# Patient Record
Sex: Male | Born: 1985 | Race: Black or African American | Hispanic: No | Marital: Single | Smoking: Never smoker
Health system: Southern US, Community
[De-identification: ages and names within clinical notes are randomized; demographics above are authoritative.]

## PROBLEM LIST (undated history)

## (undated) DIAGNOSIS — R55 Syncope and collapse: Secondary | ICD-10-CM

## (undated) HISTORY — DX: Syncope and collapse: R55

---

## 2009-12-24 ENCOUNTER — Emergency Department (HOSPITAL_COMMUNITY): Admission: EM | Admit: 2009-12-24 | Discharge: 2009-12-24 | Payer: Self-pay | Admitting: Emergency Medicine

## 2011-01-28 LAB — HIV ANTIBODY (ROUTINE TESTING W REFLEX): HIV: NONREACTIVE

## 2011-01-28 LAB — RPR: RPR Ser Ql: NONREACTIVE

## 2011-01-28 LAB — GC/CHLAMYDIA PROBE AMP, GENITAL: Chlamydia, DNA Probe: POSITIVE — AB

## 2011-08-14 ENCOUNTER — Emergency Department (HOSPITAL_COMMUNITY)
Admission: EM | Admit: 2011-08-14 | Discharge: 2011-08-14 | Discharge status: Against Medical Advice | Payer: Self-pay | Attending: Emergency Medicine | Admitting: Emergency Medicine

## 2011-08-14 DIAGNOSIS — R5383 Other fatigue: Secondary | ICD-10-CM | POA: Insufficient documentation

## 2011-08-14 DIAGNOSIS — R5381 Other malaise: Secondary | ICD-10-CM | POA: Insufficient documentation

## 2011-08-15 ENCOUNTER — Emergency Department (HOSPITAL_COMMUNITY)
Admission: EM | Admit: 2011-08-15 | Discharge: 2011-08-15 | Disposition: A | Discharge status: Home / Self Care | Payer: Self-pay | Attending: Emergency Medicine | Admitting: Emergency Medicine

## 2011-08-15 DIAGNOSIS — R9431 Abnormal electrocardiogram [ECG] [EKG]: Secondary | ICD-10-CM | POA: Insufficient documentation

## 2011-08-15 DIAGNOSIS — R42 Dizziness and giddiness: Secondary | ICD-10-CM | POA: Insufficient documentation

## 2011-08-15 DIAGNOSIS — R55 Syncope and collapse: Secondary | ICD-10-CM | POA: Insufficient documentation

## 2011-08-15 LAB — POCT I-STAT, CHEM 8
Chloride: 101 mEq/L (ref 96–112)
Glucose, Bld: 83 mg/dL (ref 70–99)
HCT: 52 % (ref 39.0–52.0)
Hemoglobin: 17.7 g/dL — ABNORMAL HIGH (ref 13.0–17.0)
Potassium: 4.3 mEq/L (ref 3.5–5.1)
Sodium: 138 mEq/L (ref 135–145)

## 2011-08-16 ENCOUNTER — Emergency Department (HOSPITAL_COMMUNITY): Payer: Self-pay

## 2011-08-16 ENCOUNTER — Emergency Department (HOSPITAL_COMMUNITY)
Admission: EM | Admit: 2011-08-16 | Discharge: 2011-08-16 | Disposition: A | Discharge status: Home / Self Care | Payer: Self-pay | Attending: Emergency Medicine | Admitting: Emergency Medicine

## 2011-08-16 DIAGNOSIS — R0789 Other chest pain: Secondary | ICD-10-CM | POA: Insufficient documentation

## 2011-08-16 DIAGNOSIS — R42 Dizziness and giddiness: Secondary | ICD-10-CM | POA: Insufficient documentation

## 2011-08-16 DIAGNOSIS — I319 Disease of pericardium, unspecified: Secondary | ICD-10-CM | POA: Insufficient documentation

## 2011-08-16 LAB — POCT I-STAT TROPONIN I: Troponin i, poc: 0 ng/mL (ref 0.00–0.08)

## 2011-08-22 ENCOUNTER — Emergency Department (HOSPITAL_COMMUNITY)
Admission: EM | Admit: 2011-08-22 | Discharge: 2011-08-22 | Disposition: A | Discharge status: Home / Self Care | Payer: Self-pay | Attending: Emergency Medicine | Admitting: Emergency Medicine

## 2011-08-22 DIAGNOSIS — IMO0002 Reserved for concepts with insufficient information to code with codable children: Secondary | ICD-10-CM | POA: Insufficient documentation

## 2011-08-22 DIAGNOSIS — R55 Syncope and collapse: Secondary | ICD-10-CM | POA: Insufficient documentation

## 2011-08-22 DIAGNOSIS — I498 Other specified cardiac arrhythmias: Secondary | ICD-10-CM | POA: Insufficient documentation

## 2011-08-22 DIAGNOSIS — F1010 Alcohol abuse, uncomplicated: Secondary | ICD-10-CM | POA: Insufficient documentation

## 2011-08-22 DIAGNOSIS — F411 Generalized anxiety disorder: Secondary | ICD-10-CM | POA: Insufficient documentation

## 2011-08-22 LAB — DIFFERENTIAL
Basophils Absolute: 0 10*3/uL (ref 0.0–0.1)
Basophils Relative: 1 % (ref 0–1)
Eosinophils Relative: 1 % (ref 0–5)
Monocytes Absolute: 0.5 10*3/uL (ref 0.1–1.0)
Neutro Abs: 3 10*3/uL (ref 1.7–7.7)

## 2011-08-22 LAB — POCT I-STAT, CHEM 8
Creatinine, Ser: 1.4 mg/dL — ABNORMAL HIGH (ref 0.50–1.35)
Glucose, Bld: 89 mg/dL (ref 70–99)
HCT: 42 % (ref 39.0–52.0)
Hemoglobin: 14.3 g/dL (ref 13.0–17.0)
Sodium: 143 mEq/L (ref 135–145)
TCO2: 27 mmol/L (ref 0–100)

## 2011-08-22 LAB — CBC
MCHC: 33.3 g/dL (ref 30.0–36.0)
RDW: 11.9 % (ref 11.5–15.5)
WBC: 5.4 10*3/uL (ref 4.0–10.5)

## 2011-08-22 LAB — POCT I-STAT TROPONIN I: Troponin i, poc: 0 ng/mL (ref 0.00–0.08)

## 2011-09-15 ENCOUNTER — Encounter: Payer: Self-pay | Admitting: Cardiology

## 2011-09-16 ENCOUNTER — Encounter: Payer: Self-pay | Admitting: Cardiology

## 2011-09-16 ENCOUNTER — Ambulatory Visit (INDEPENDENT_AMBULATORY_CARE_PROVIDER_SITE_OTHER): Payer: Self-pay | Admitting: Cardiology

## 2011-09-16 VITALS — BP 140/82 | HR 80 | Ht 70.0 in | Wt 146.1 lb

## 2011-09-16 DIAGNOSIS — R55 Syncope and collapse: Secondary | ICD-10-CM

## 2011-09-16 DIAGNOSIS — R000 Tachycardia, unspecified: Secondary | ICD-10-CM | POA: Insufficient documentation

## 2011-09-16 NOTE — Assessment & Plan Note (Signed)
The episode of syncope in the ER note seems to be compounded by alcohol use. However, he otherwise has a long history of presyncope. I will apply a 21 day event monitor. I will check an echocardiogram. If these are normal it is unlikely he would need further workup.

## 2011-09-16 NOTE — Progress Notes (Signed)
HPI The patient presents for evaluation of syncope. He said he sat a history of near syncope for a couple of years. He said it happens a couple of times daily. Recently however he reports a frank syncopal episode. He was actually playing cards when this happened. I reviewed the report from the emergency room. In the emergency room he apparently had altered mental status and did wake with a sternal rub and ammonia capsule. He was described as "crying and reluctant to answer questions." He did report that he had a sensation of feeling overwhelmed at the time of this event. He denies panic. He's not had a history of anxiety. His cardiac enzymes were negative. He did have the EKG described below. He was not admitted to the hospital. Telemetry did demonstrate sinus tachycardia while in the hospital. Of note the patient is reported to have been drinking that night. I don't see an alcohol level but the ER discharge diagnosis mentions acute alcohol.  The patient has had no prior cardiac workup. He doesn't describe orthostatic symptoms. He does get some chest discomfort. He reports that this can happen at rest. It is sharp and substernal. He doesn't exercise routinely but he climbs stairs to his apartment. His heart will be racing at the end of this but he doesn't necessarily have chest discomfort. He doesn't report any significant shortness of breath, PND or orthopnea. Of note he says that he has had no spells since this ER visit.  No Known Allergies  No current outpatient prescriptions on file.    Past Medical History  Diagnosis Date  . Chest pain   . Syncope     No past surgical history on file.  Family History  Problem Relation Age of Onset  . Stroke Maternal Grandmother 48  . Hypertension Maternal Grandmother     History   Social History  . Marital Status: Single    Spouse Name: N/A    Number of Children: 1  . Years of Education: N/A   Occupational History  . Works in Airline pilot    Social  History Main Topics  . Smoking status: Never Smoker   . Smokeless tobacco: Not on file  . Alcohol Use: No  . Drug Use: No  . Sexually Active: Not on file   Other Topics Concern  . Not on file   Social History Narrative  . No narrative on file    ROS:  As stated in the HPI and negative for all other systems.  PHYSICAL EXAM BP 140/82  Pulse 80  Ht 5\' 10"  (1.778 m)  Wt 146 lb 1.9 oz (66.28 kg)  BMI 20.97 kg/m2 GENERAL:  Well appearing, thin HEENT:  Pupils equal round and reactive, fundi not visualized, oral mucosa unremarkable NECK:  No jugular venous distention, waveform within normal limits, carotid upstroke brisk and symmetric, no bruits, no thyromegaly LYMPHATICS:  No cervical, inguinal adenopathy LUNGS:  Clear to auscultation bilaterally BACK:  No CVA tenderness CHEST:  Unremarkable HEART:  PMI not displaced or sustained,S1 and S2 within normal limits, no S3, no S4, no clicks, no rubs, no murmurs ABD:  Flat, positive bowel sounds normal in frequency in pitch, no bruits, no rebound, no guarding, no midline pulsatile mass, no hepatomegaly, no splenomegaly EXT:  2 plus pulses throughout, no edema, no cyanosis no clubbing SKIN:  No rashes no nodules NEURO:  Cranial nerves II through XII grossly intact, motor grossly intact throughout PSYCH:  Cognitively intact, oriented to person place and time  EKG:  08/22/11  sinus tachycardia, rate 119, questionable left ventricular hypertrophy versus normal pattern for a young thin individual.  No old EKGs to compare to  ASSESSMENT AND PLAN

## 2011-09-16 NOTE — Patient Instructions (Signed)
Your physician has recommended that you wear an event monitor. Event monitors are medical devices that record the heart's electrical activity. Doctors most often [place] these monitors to diagnose arrhythmias. Arrhythmias are problems with the speed or rhythm of the heartbeat. The monitor is a small, portable device. You can wear one while you do your normal daily activities. This is usually used to diagnose what is causing palpitations/syncope (passing out).        21 day event monitor  Your physician has requested that you have an echocardiogram. Echocardiography is a painless test that uses sound waves to create images of your heart. It provides your doctor with information about the size and shape of your heart and how well your heart's chambers and valves are working. This procedure takes approximately one hour. There are no restrictions for this procedure.  Your physician recommends that you return for lab work within the next 2 weeks, same day as event monitor placement.  TSH  Please follow up with Tereso Newcomer, PA in one month OR after event monitor is completed.

## 2011-09-16 NOTE — Assessment & Plan Note (Signed)
I will check a TSH. I will otherwise check the studies listed above.  Further work up will be based on these results.  Again, if these are normal I doubt that further work up will be indicated.

## 2011-09-23 ENCOUNTER — Other Ambulatory Visit (INDEPENDENT_AMBULATORY_CARE_PROVIDER_SITE_OTHER): Payer: Self-pay | Admitting: *Deleted

## 2011-09-23 ENCOUNTER — Encounter (INDEPENDENT_AMBULATORY_CARE_PROVIDER_SITE_OTHER): Payer: Self-pay

## 2011-09-23 ENCOUNTER — Ambulatory Visit (HOSPITAL_COMMUNITY): Payer: Self-pay | Attending: Cardiovascular Disease | Admitting: Radiology

## 2011-09-23 DIAGNOSIS — R55 Syncope and collapse: Secondary | ICD-10-CM

## 2011-09-23 DIAGNOSIS — R000 Tachycardia, unspecified: Secondary | ICD-10-CM | POA: Insufficient documentation

## 2011-10-07 ENCOUNTER — Telehealth: Payer: Self-pay | Admitting: Cardiology

## 2011-10-07 NOTE — Telephone Encounter (Signed)
Pt calling for results of monitor - I will check with Dr Antoine Poche to see if he has the results and what they are.  Pt aware I will call him back with the results.

## 2011-10-07 NOTE — Telephone Encounter (Signed)
New problem Pt is calling about monitor results please call him back

## 2011-10-08 NOTE — Telephone Encounter (Signed)
FU Call: Pt returning call to [facility] from yesterday. Please return pt call to discuss further.

## 2011-10-08 NOTE — Telephone Encounter (Signed)
Reviewed results of monitor with pt and answered his questions.  He thanked me for my time.

## 2011-10-14 ENCOUNTER — Ambulatory Visit: Payer: Self-pay | Admitting: Physician Assistant

## 2011-10-21 ENCOUNTER — Ambulatory Visit: Payer: Self-pay | Admitting: Physician Assistant

## 2014-12-24 ENCOUNTER — Telehealth: Payer: Self-pay | Admitting: *Deleted

## 2014-12-24 ENCOUNTER — Ambulatory Visit: Payer: Self-pay | Admitting: Cardiology

## 2014-12-24 NOTE — Telephone Encounter (Signed)
Attempted to call patient to see why he missed his appt and reschedule the office visit with Dr Antoine PocheHochrein

## 2015-01-07 ENCOUNTER — Encounter: Payer: Self-pay | Admitting: Cardiology

## 2016-12-13 ENCOUNTER — Emergency Department (HOSPITAL_COMMUNITY): Payer: Self-pay

## 2016-12-13 ENCOUNTER — Emergency Department (HOSPITAL_COMMUNITY)
Admission: EM | Admit: 2016-12-13 | Discharge: 2016-12-13 | Disposition: A | Discharge status: Home / Self Care | Payer: Self-pay | Attending: Emergency Medicine | Admitting: Emergency Medicine

## 2016-12-13 ENCOUNTER — Encounter (HOSPITAL_COMMUNITY): Payer: Self-pay | Admitting: Emergency Medicine

## 2016-12-13 DIAGNOSIS — R0789 Other chest pain: Secondary | ICD-10-CM | POA: Insufficient documentation

## 2016-12-13 DIAGNOSIS — Z5321 Procedure and treatment not carried out due to patient leaving prior to being seen by health care provider: Secondary | ICD-10-CM | POA: Insufficient documentation

## 2016-12-13 LAB — CBC
HEMATOCRIT: 41.8 % (ref 39.0–52.0)
HEMOGLOBIN: 13.6 g/dL (ref 13.0–17.0)
MCH: 27.3 pg (ref 26.0–34.0)
MCHC: 32.5 g/dL (ref 30.0–36.0)
MCV: 83.8 fL (ref 78.0–100.0)
Platelets: 292 10*3/uL (ref 150–400)
RBC: 4.99 MIL/uL (ref 4.22–5.81)
RDW: 12.7 % (ref 11.5–15.5)
WBC: 5.6 10*3/uL (ref 4.0–10.5)

## 2016-12-13 LAB — I-STAT TROPONIN, ED: Troponin i, poc: 0 ng/mL (ref 0.00–0.08)

## 2016-12-13 LAB — BASIC METABOLIC PANEL
Anion gap: 8 (ref 5–15)
BUN: 12 mg/dL (ref 6–20)
CHLORIDE: 102 mmol/L (ref 101–111)
CO2: 26 mmol/L (ref 22–32)
Calcium: 9 mg/dL (ref 8.9–10.3)
Creatinine, Ser: 1.08 mg/dL (ref 0.61–1.24)
GFR calc non Af Amer: >60 mL/min (ref >60)
Glucose, Bld: 84 mg/dL (ref 65–99)
POTASSIUM: 3.3 mmol/L — AB (ref 3.5–5.1)
SODIUM: 136 mmol/L (ref 135–145)

## 2016-12-13 NOTE — ED Notes (Signed)
Pt has been called 3 times with no response from lobby.

## 2016-12-13 NOTE — ED Triage Notes (Signed)
Pt complaint of intermittent central chest tightness onset last night; associated "light" left arm.

## 2016-12-13 NOTE — ED Triage Notes (Signed)
1st call for pt with no response from lobby.

## 2017-06-20 IMAGING — CR DG CHEST 2V
2 series · 2 of 2 positions shown · non-contrast
Comparison: 08/16/2011

CLINICAL DATA: Central chest tightness.

EXAM:
CHEST  2 VIEW

[w chest pa]
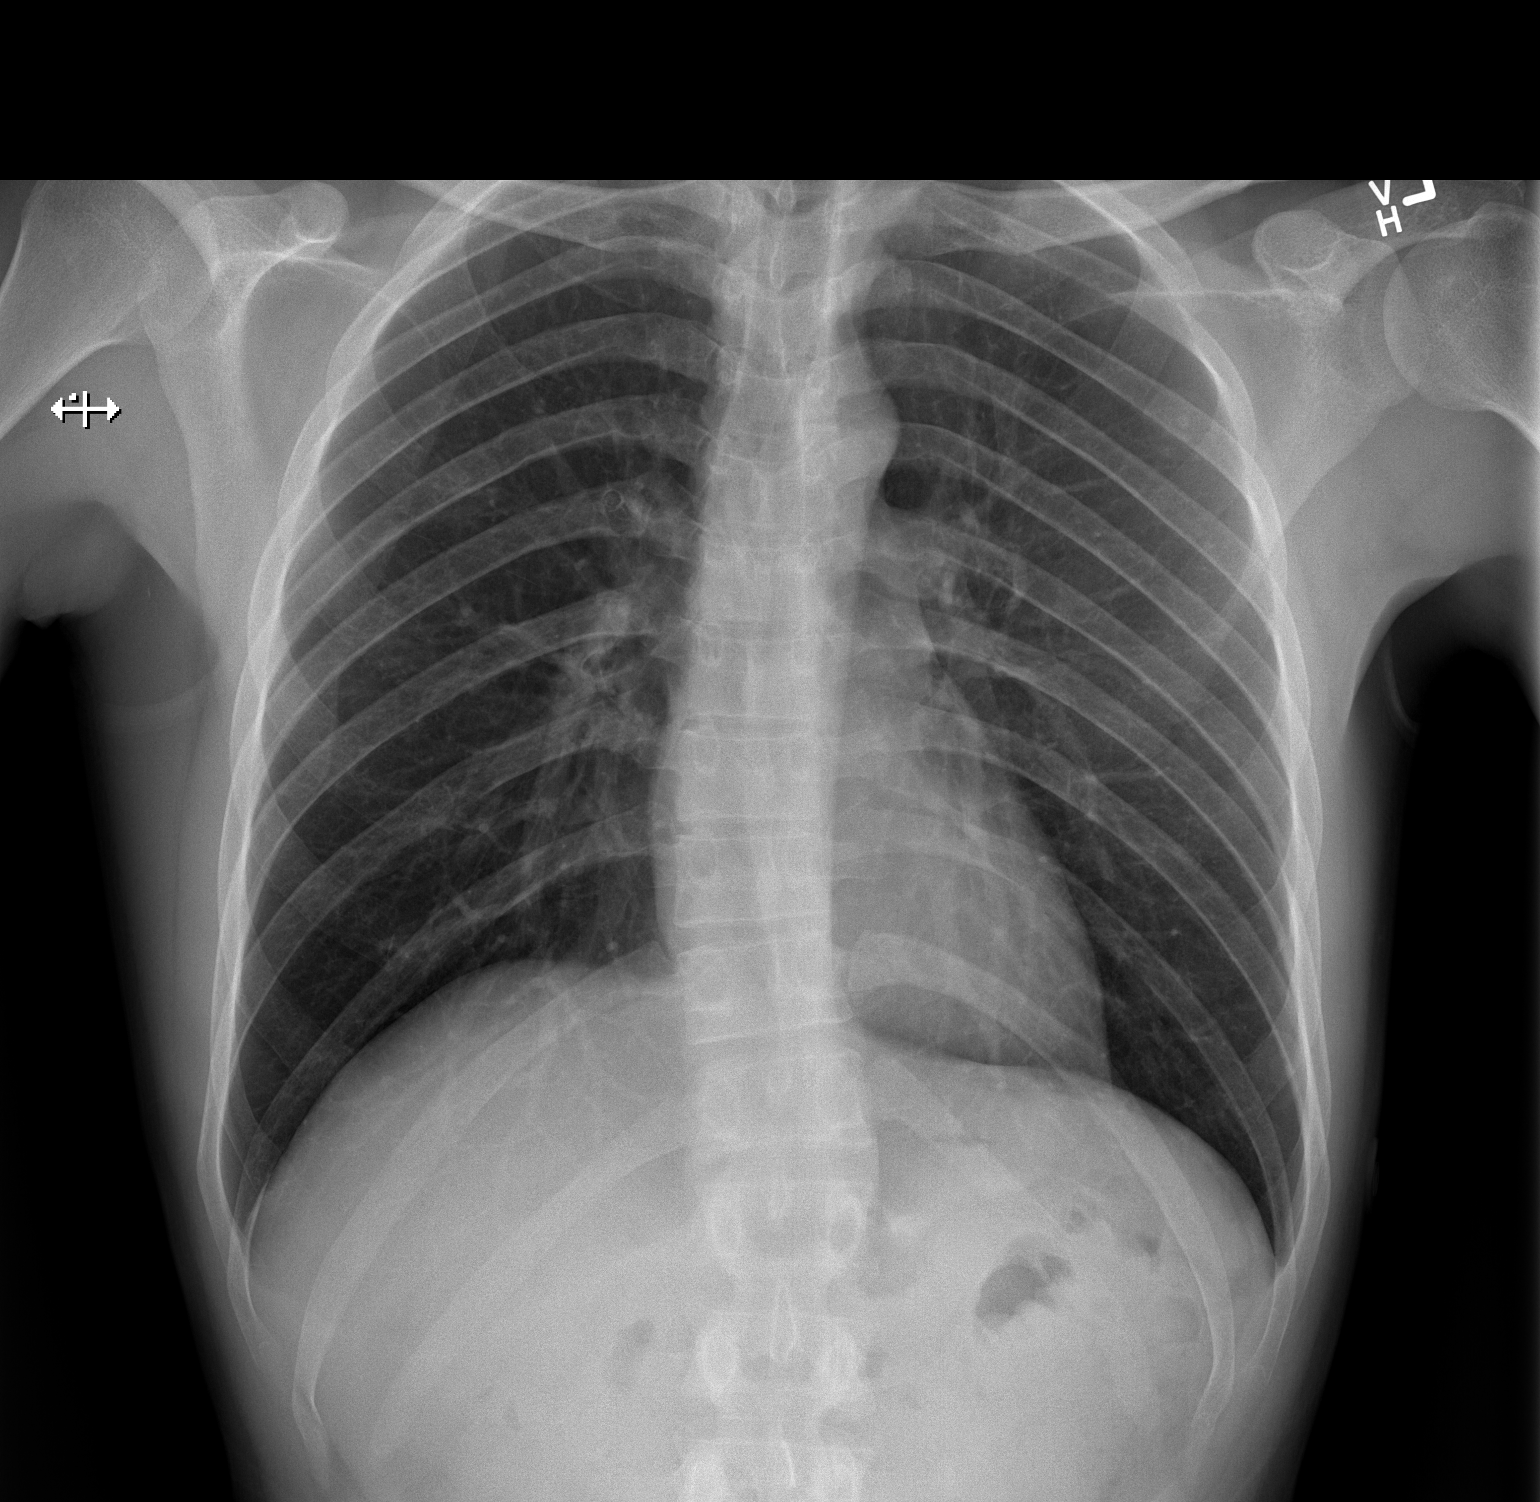

[w chest lat]
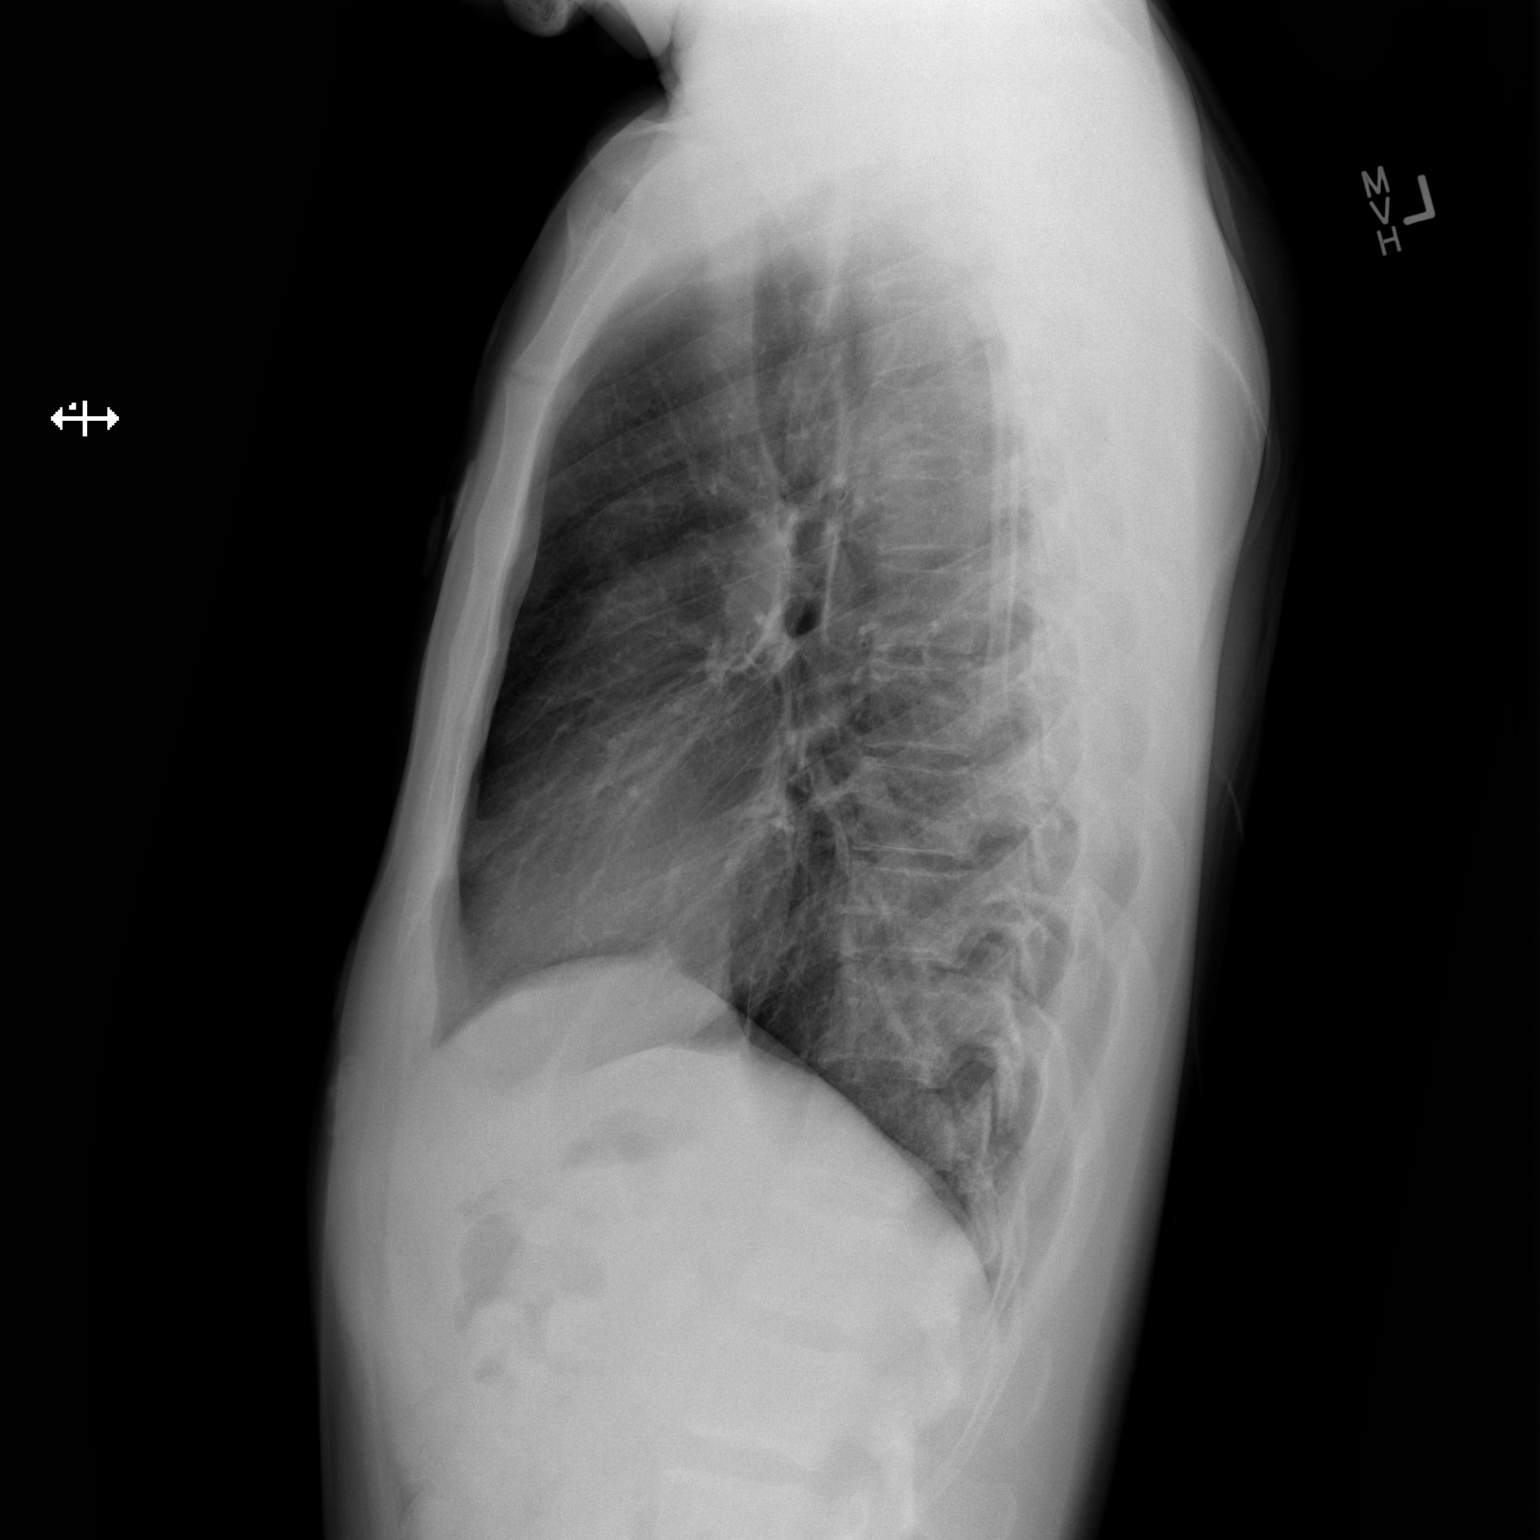

[2 of 2 positions shown; findings below may reference images not displayed]

FINDINGS: The lungs are clear wiithout focal pneumonia, edema, pneumothorax or
pleural effusion. The cardiopericardial silhouette is within normal
limits for size. Thoracolumbar scoliosis again noted. Nodular
density/densities projecting over the lungs are compatible with pads
for telemetry leads.
IMPRESSION: No active cardiopulmonary disease.

## 2021-06-05 ENCOUNTER — Emergency Department (HOSPITAL_COMMUNITY)
Admission: EM | Admit: 2021-06-05 | Discharge: 2021-06-05 | Disposition: A | Discharge status: Home / Self Care | Payer: Self-pay | Attending: Emergency Medicine | Admitting: Emergency Medicine

## 2021-06-05 ENCOUNTER — Emergency Department (HOSPITAL_COMMUNITY): Payer: Self-pay

## 2021-06-05 ENCOUNTER — Other Ambulatory Visit: Payer: Self-pay

## 2021-06-05 ENCOUNTER — Encounter (HOSPITAL_COMMUNITY): Payer: Self-pay

## 2021-06-05 DIAGNOSIS — R000 Tachycardia, unspecified: Secondary | ICD-10-CM | POA: Insufficient documentation

## 2021-06-05 DIAGNOSIS — Z28310 Unvaccinated for covid-19: Secondary | ICD-10-CM | POA: Insufficient documentation

## 2021-06-05 DIAGNOSIS — U071 COVID-19: Secondary | ICD-10-CM | POA: Insufficient documentation

## 2021-06-05 LAB — COMPREHENSIVE METABOLIC PANEL
ALT: 15 U/L (ref 0–44)
AST: 35 U/L (ref 15–41)
Albumin: 3.6 g/dL (ref 3.5–5.0)
Alkaline Phosphatase: 39 U/L (ref 38–126)
Anion gap: 8 (ref 5–15)
BUN: 12 mg/dL (ref 6–20)
CO2: 25 mmol/L (ref 22–32)
Calcium: 8.9 mg/dL (ref 8.9–10.3)
Chloride: 101 mmol/L (ref 98–111)
Creatinine, Ser: 1.11 mg/dL (ref 0.61–1.24)
GFR, Estimated: >60 mL/min (ref >60)
Glucose, Bld: 98 mg/dL (ref 70–99)
Potassium: 4.2 mmol/L (ref 3.5–5.1)
Sodium: 134 mmol/L — ABNORMAL LOW (ref 135–145)
Total Bilirubin: 1.5 mg/dL — ABNORMAL HIGH (ref 0.3–1.2)
Total Protein: 7 g/dL (ref 6.5–8.1)

## 2021-06-05 LAB — CBC WITH DIFFERENTIAL/PLATELET
Abs Immature Granulocytes: 0.01 10*3/uL (ref 0.00–0.07)
Basophils Absolute: 0 10*3/uL (ref 0.0–0.1)
Basophils Relative: 1 %
Eosinophils Absolute: 0 10*3/uL (ref 0.0–0.5)
Eosinophils Relative: 0 %
HCT: 49.9 % (ref 39.0–52.0)
Hemoglobin: 16 g/dL (ref 13.0–17.0)
Immature Granulocytes: 0 %
Lymphocytes Relative: 51 %
Lymphs Abs: 1.4 10*3/uL (ref 0.7–4.0)
MCH: 28 pg (ref 26.0–34.0)
MCHC: 32.1 g/dL (ref 30.0–36.0)
MCV: 87.4 fL (ref 80.0–100.0)
Monocytes Absolute: 0.6 10*3/uL (ref 0.1–1.0)
Monocytes Relative: 20 %
Neutro Abs: 0.8 10*3/uL — ABNORMAL LOW (ref 1.7–7.7)
Neutrophils Relative %: 28 %
Platelets: 210 10*3/uL (ref 150–400)
RBC: 5.71 MIL/uL (ref 4.22–5.81)
RDW: 11.9 % (ref 11.5–15.5)
WBC: 2.8 10*3/uL — ABNORMAL LOW (ref 4.0–10.5)
nRBC: 0 % (ref 0.0–0.2)

## 2021-06-05 LAB — TROPONIN I (HIGH SENSITIVITY): Troponin I (High Sensitivity): 6 ng/L (ref <18)

## 2021-06-05 MED ORDER — ACETAMINOPHEN 325 MG PO TABS
650.0000 mg | ORAL_TABLET | Freq: Once | ORAL | Status: AC
  Administered 2021-06-05: 650 mg via ORAL
  Filled 2021-06-05: qty 2

## 2021-06-05 MED ORDER — SODIUM CHLORIDE 0.9 % IV BOLUS
1000.0000 mL | Freq: Once | INTRAVENOUS | Status: AC
  Administered 2021-06-05: 1000 mL via INTRAVENOUS

## 2021-06-05 NOTE — ED Notes (Signed)
C/o face head and CP/pressure. Endorses NVD. Onset Wednesday.

## 2021-06-05 NOTE — Discharge Instructions (Addendum)
Please continue to take Tylenol and ibuprofen for management of your body aches.  If you develop any new or worsening symptoms please come back to the emergency department.  It was a pleasure to meet you.

## 2021-06-05 NOTE — ED Notes (Signed)
Ambulated pt in room, pt's O2 saturation remained at 100% while ambulating. Patient stated he did not feel lightheaded or short of breath while ambulating.

## 2021-06-05 NOTE — ED Triage Notes (Signed)
Patient complains of ongoing SOB, weakness, diarrhea and headache since testing positive for Covid on Wednesday 7/27. No vaccines. Alert and oriented, in no distress

## 2021-06-05 NOTE — ED Provider Notes (Signed)
[facility] EMERGENCY DEPARTMENT Provider Note   CSN: [id number] Arrival date & time: 06/05/21  6213     History No chief complaint on file.   Antonio Bell is a 35 y.o. male.  HPI Patient is a 35 year old male who presents to the emergency department due to ongoing shortness of breath with exertion, weakness, diarrhea, intermittent headaches, cough, congestion.  Patient states his symptoms started about 3 days ago.  He had a positive home COVID-19 test as well as a positive PCR test.  He has not been vaccinated for COVID-19.  He notes some chest pain when coughing.  States that his shortness of breath only presents with exertion.  Notes an episode of vomiting "when taking medicine" but no persistent nausea or vomiting.  Reports intermittent diarrhea but no hematochezia.  No leg swelling or hemoptysis.    Past Medical History:  Diagnosis Date   Chest pain    Syncope     Patient Active Problem List   Diagnosis Date Noted   Syncope 09/16/2011   Tachycardia 09/16/2011    History reviewed. No pertinent surgical history.     Family History  Problem Relation Age of Onset   Stroke Maternal Grandmother 54   Hypertension Maternal Grandmother     Social History   Tobacco Use   Smoking status: Never  Substance Use Topics   Alcohol use: No   Drug use: No    Home Medications Prior to Admission medications   Not on File    Allergies    Patient has no known allergies.  Review of Systems   Review of Systems  All other systems reviewed and are negative. Ten systems reviewed and are negative for acute change, except as noted in the HPI.   Physical Exam Updated Vital Signs BP 115/83   Pulse 65   Temp 99.5 F (37.5 C) (Oral)   Resp 20   SpO2 99%   Physical Exam Vitals and nursing note reviewed.  Constitutional:      General: He is not in acute distress.    Appearance: Normal appearance. He is not ill-appearing, toxic-appearing or  diaphoretic.  HENT:     Head: Normocephalic and atraumatic.     Right Ear: External ear normal.     Left Ear: External ear normal.     Nose: Nose normal.     Mouth/Throat:     Mouth: Mucous membranes are moist.     Pharynx: Oropharynx is clear. No oropharyngeal exudate or posterior oropharyngeal erythema.     Comments: Uvula midline.  No erythema noted in the posterior oropharynx.  No exudates.  Readily handling secretions. Eyes:     General: No scleral icterus.       Right eye: No discharge.        Left eye: No discharge.     Extraocular Movements: Extraocular movements intact.     Conjunctiva/sclera: Conjunctivae normal.  Cardiovascular:     Rate and Rhythm: Regular rhythm. Tachycardia present.     Pulses: Normal pulses.     Heart sounds: Normal heart sounds. No murmur heard.   No friction rub. No gallop.  Pulmonary:     Effort: Pulmonary effort is normal. No respiratory distress.     Breath sounds: Normal breath sounds. No stridor. No wheezing, rhonchi or rales.     Comments: LCTAB Abdominal:     General: Abdomen is flat.     Palpations: Abdomen is soft.     Tenderness: There is no  abdominal tenderness.     Comments: Abdomen is flat, soft, and nontender.  Musculoskeletal:        General: Normal range of motion.     Cervical back: Normal range of motion and neck supple. No tenderness.     Right lower leg: No edema.     Left lower leg: No edema.     Comments: 2+ DP pulses.  No pedal edema.  Skin:    General: Skin is warm and dry.  Neurological:     General: No focal deficit present.     Mental Status: He is alert and oriented to person, place, and time.  Psychiatric:        Mood and Affect: Mood normal.        Behavior: Behavior normal.   ED Results / Procedures / Treatments   Labs (all labs ordered are listed, but only abnormal results are displayed) Labs Reviewed  COMPREHENSIVE METABOLIC PANEL - Abnormal; Notable for the following components:      Result Value    Sodium 134 (*)    Total Bilirubin 1.5 (*)    All other components within normal limits  CBC WITH DIFFERENTIAL/PLATELET - Abnormal; Notable for the following components:   WBC 2.8 (*)    Neutro Abs 0.8 (*)    All other components within normal limits  PATHOLOGIST SMEAR REVIEW  TROPONIN I (HIGH SENSITIVITY)    EKG EKG Interpretation  Date/Time:  Friday June 05 2021 07:16:49 EDT Ventricular Rate:  121 PR Interval:  158 QRS Duration: 78 QT Interval:  420 QTC Calculation: 596 R Axis:   84 Text Interpretation: Sinus tachycardia Biatrial enlargement Left ventricular hypertrophy ( Sokolow-Lyon , Cornell product , Romhilt-Estes ) Cannot rule out Septal infarct , age undetermined Abnormal ECG No significant change since last tracing Confirmed by Linwood Dibbles [phone]) on 06/05/2021 7:53:50 AM  Radiology DG Chest Portable 1 View  Result Date: 06/05/2021 CLINICAL DATA:  SOB, weakness, diarrhea and headache since testing positive for Covid on Wednesday 7/27. EXAM: PORTABLE CHEST - 1 VIEW COMPARISON:  12/13/2016 FINDINGS: Lungs are clear. Heart size and mediastinal contours are within normal limits. No effusion.  No pneumothorax. Visualized bones unremarkable. IMPRESSION: No acute cardiopulmonary disease. Electronically Signed   By: Corlis Leak M.D.   On: 06/05/2021 07:51    Procedures Procedures   Medications Ordered in ED Medications  sodium chloride 0.9 % bolus 1,000 mL (0 mLs Intravenous Stopped 06/05/21 0913)  acetaminophen (TYLENOL) tablet 650 mg (650 mg Oral Given 06/05/21 0919)    ED Course  I have reviewed the triage vital signs and the nursing notes.  Pertinent labs & imaging results that were available during my care of the patient were reviewed by me and considered in my medical decision making (see chart for details).  Clinical Course as of 06/05/21 1127  Fri Jun 05, 2021  8416 Patient ambulated with pulse ox.  No hypoxia but was noted to be orthostatic.  Will obtain basic labs and  give patient IV fluids. [LJ]    Clinical Course User Index [LJ] Placido Sou, PA-C   MDM Rules/Calculators/A&P                          Pt is a 35 y.o. male who presents to the emergency department with symptoms associated with COVID-19.  Labs: CBC with a white count of 2.8 and neutrophils of 0.8. CMP with a sodium of 134 and a total bilirubin  of 1.5. Troponin of 6.  Imaging: Chest x-ray is negative for acute cardiopulmonary disease.  I, Placido Sou, PA-C, personally reviewed and evaluated these images and lab results as part of my medical decision-making.  Patient initially exhibited positive orthostatic signs.  Obtain basic labs which were mostly reassuring.  Given Tylenol as well as a liter of IV fluids.  Repeated ambulation and patient now symptom-free.  Denies any chest pain, shortness of breath, lightheadedness.  States he is feeling much better.  Heart rate has improved to about 70 bpm.  Feel the patient is stable for discharge at this time and he is agreeable.  Recommended that he continue to quarantine.  Discussed Tylenol/ibuprofen for body aches as well as fevers.  Discussed staying adequately hydrated.  Discussed return precautions.  His questions were answered and he was amicable at the time of discharge.  Note: Portions of this report may have been transcribed using voice recognition software. Every effort was made to ensure accuracy; however, inadvertent computerized transcription errors may be present.   Final Clinical Impression(s) / ED Diagnoses Final diagnoses:  COVID-19   Rx / DC Orders ED Discharge Orders     None        Placido Sou, PA-C 06/05/21 1128    Linwood Dibbles, MD 06/06/21 1401

## 2021-06-05 NOTE — ED Notes (Addendum)
back to room from triage by w/c. Alert, NAD, calm. EDPA into room prior to RN assessment, see PA notes, pending orders.

## 2021-06-09 LAB — PATHOLOGIST SMEAR REVIEW

## 2021-12-11 IMAGING — DX DG CHEST 1V PORT
1 series · 1 of 1 positions shown · non-contrast
Comparison: 12/13/2016

CLINICAL DATA: SOB, weakness, diarrhea and headache since testing
positive for Covid on [REDACTED] [DATE].

EXAM:
PORTABLE CHEST - 1 VIEW

[chest]
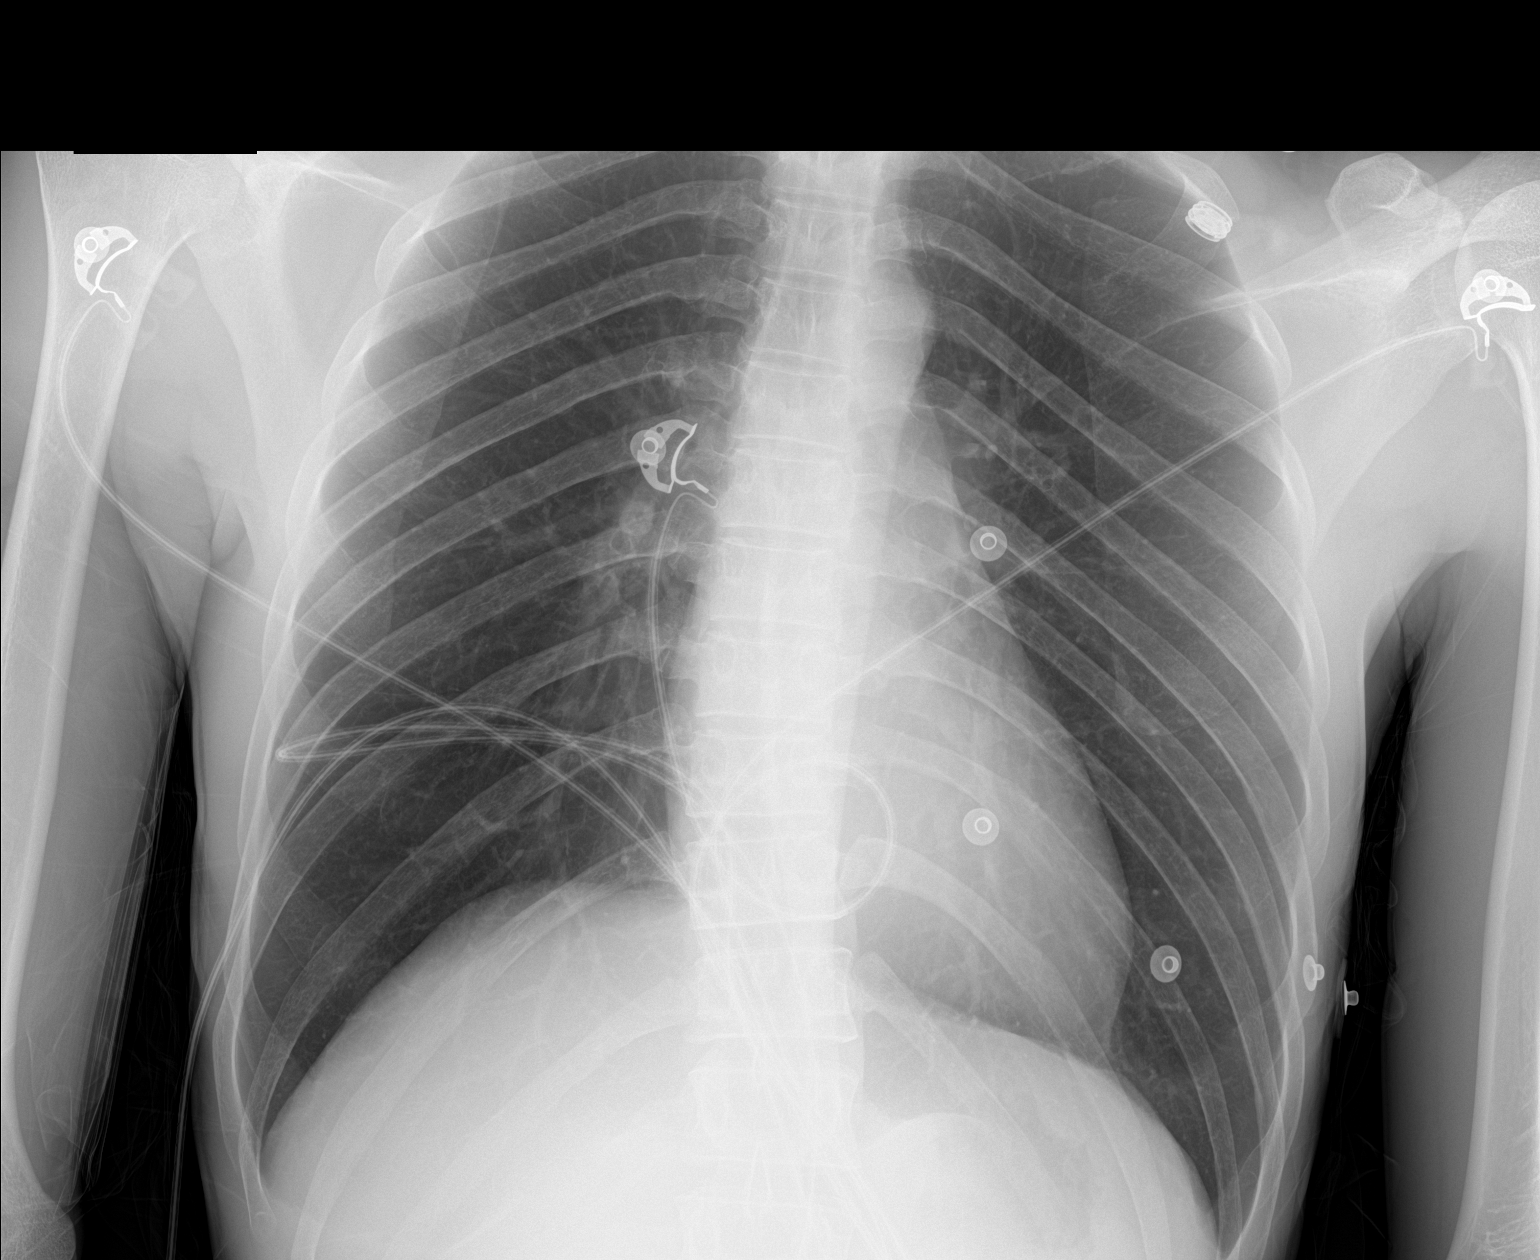

[1 of 1 positions shown; findings below may reference images not displayed]

FINDINGS: Lungs are clear.

Heart size and mediastinal contours are within normal limits.

No effusion.  No pneumothorax.

Visualized bones unremarkable.
IMPRESSION: No acute cardiopulmonary disease.

## 2024-11-14 ENCOUNTER — Ambulatory Visit
Admission: EM | Admit: 2024-11-14 | Discharge: 2024-11-14 | Disposition: A | Discharge status: Home / Self Care | Source: Home / Self Care

## 2024-11-14 DIAGNOSIS — K921 Melena: Secondary | ICD-10-CM | POA: Insufficient documentation

## 2024-11-14 DIAGNOSIS — K6289 Other specified diseases of anus and rectum: Secondary | ICD-10-CM | POA: Diagnosis present

## 2024-11-14 DIAGNOSIS — Z113 Encounter for screening for infections with a predominantly sexual mode of transmission: Secondary | ICD-10-CM | POA: Diagnosis present

## 2024-11-14 DIAGNOSIS — K625 Hemorrhage of anus and rectum: Secondary | ICD-10-CM | POA: Diagnosis present

## 2024-11-14 MED ORDER — LIDOCAINE (ANORECTAL) 5 % EX GEL: CUTANEOUS | 0 refills | Status: AC

## 2024-11-14 NOTE — ED Provider Notes (Signed)
 " [facility]  Note:  This document was prepared using Conservation officer, historic buildings and may include unintentional dictation errors.  MRN: [id number] DOB: 1986/08/07  Subjective:   Antonio Bell is a 39 y.o. male with PMH of hemorrhoids, chronic anal fissure presenting for 1 week history of persistent worsening perianal pain, bleeding.  Passing bloody stools as well.  Has felt some chills and night sweats, feels swelling to lymph nodes of the left side of his neck.  Patient is sexually active, has receptive anal sex.  No rashes, fever, chest pain, nausea, vomiting, belly pain.  Denies dysuria, hematuria, urinary frequency, penile discharge, penile swelling, testicular pain, testicular swelling, groin pain.   No current outpatient medications  Allergies[1]  Past Medical History:  Diagnosis Date   Chest pain    Syncope      History reviewed. No pertinent surgical history.  Family History  Problem Relation Age of Onset   Stroke Maternal Grandmother 63   Hypertension Maternal Grandmother     Social History   Occupational History   Occupation: Works in Investment Banker, [employer]: [employer]  Tobacco Use   Smoking status: Never   Smokeless tobacco: Never  Vaping Use   Vaping status: Never Used  Substance and Sexual Activity   Alcohol use: Yes    Comment: occ   Drug use: No   Sexual activity: Not on file     ROS   Objective:   Vitals: BP 122/80 (BP Location: Right Arm)   Pulse 94   Temp 99.8 F (37.7 C) (Oral)   Resp 20   SpO2 97%   Physical Exam Chaperone present: patient declined.  Constitutional:      General: He is not in acute distress.    Appearance: Normal appearance. He is well-developed and normal weight. He is not ill-appearing, toxic-appearing or diaphoretic.  HENT:     Head: Normocephalic and atraumatic.     Right Ear: External ear normal.     Left Ear: External ear normal.     Nose: Nose normal.     Mouth/Throat:      Pharynx: Oropharynx is clear.  Eyes:     General: No scleral icterus.       Right eye: No discharge.        Left eye: No discharge.     Extraocular Movements: Extraocular movements intact.  Cardiovascular:     Rate and Rhythm: Normal rate.  Pulmonary:     Effort: Pulmonary effort is normal.  Genitourinary:    Rectum: No mass, tenderness, anal fissure or external hemorrhoid.  Musculoskeletal:     Cervical back: Normal range of motion.  Neurological:     Mental Status: He is alert and oriented to person, place, and time.  Psychiatric:        Mood and Affect: Mood normal.        Behavior: Behavior normal.        Thought Content: Thought content normal.        Judgment: Judgment normal.     Assessment and Plan :   PDMP not reviewed this encounter.  1. Anal pain   2. Screening examination for STI   3. Anal bleeding   4. Bloody stools      No sign of fissures, hemorrhoids, patient tolerated exam and sample collection well.  Complete STI check pending.  Recommended following up with his GI specialist.  For now can use symptomatic relief with lidocaine  gel.  Counseled patient on potential for adverse effects with medications prescribed/recommended today, ER and return-to-clinic precautions discussed, patient verbalized understanding.     [1] No Known Allergies    Christopher Savannah, [place] 11/15/24 0813  "

## 2024-11-14 NOTE — ED Triage Notes (Signed)
 Pt c/o rectal bleeding, night sweats x 1 week-swelling to lymph node left side of neck x today-NAD-steady gait

## 2024-11-14 NOTE — Discharge Instructions (Signed)
 You can use lidocaine  gel to the anal area for pain relief. We will update you on your test results tomorrow. Reach out to your GI specialist for a recheck given your bloody stools. Do not use any nonsteroidal anti-inflammatories (NSAIDs) like ibuprofen, Motrin, naproxen, Aleve, etc. which are all available over-the-counter.  Please just use Tylenol  at a dose of 500mg -650mg  once every 6 hours as needed for your aches, pains, fevers.

## 2024-11-15 LAB — CYTOLOGY, (ORAL, ANAL, URETHRAL) ANCILLARY ONLY
Chlamydia: NEGATIVE
Chlamydia: NEGATIVE
Comment: NEGATIVE
Comment: NEGATIVE
Comment: NORMAL
Comment: NEGATIVE
Comment: NORMAL
Neisseria Gonorrhea: NEGATIVE
Neisseria Gonorrhea: NEGATIVE
Trichomonas: NEGATIVE

## 2024-11-15 LAB — SYPHILIS: RPR W/REFLEX TO RPR TITER AND TREPONEMAL ANTIBODIES, TRADITIONAL SCREENING AND DIAGNOSIS ALGORITHM: RPR Ser Ql: NONREACTIVE

## 2024-11-15 LAB — HIV ANTIBODY (ROUTINE TESTING W REFLEX): HIV Screen 4th Generation wRfx: NONREACTIVE

## 2024-12-24 ENCOUNTER — Ambulatory Visit: Payer: Self-pay | Admitting: Critical Care Medicine
